# Patient Record
Sex: Male | Born: 1945 | Race: White | Hispanic: No | Marital: Single
Health system: Southern US, Community
[De-identification: ages and names within clinical notes are randomized; demographics above are authoritative.]

## PROBLEM LIST (undated history)

## (undated) DIAGNOSIS — I10 Essential (primary) hypertension: Secondary | ICD-10-CM

## (undated) DIAGNOSIS — E119 Type 2 diabetes mellitus without complications: Secondary | ICD-10-CM

## (undated) DIAGNOSIS — F0390 Unspecified dementia without behavioral disturbance: Secondary | ICD-10-CM

## (undated) DIAGNOSIS — I509 Heart failure, unspecified: Secondary | ICD-10-CM

## (undated) DIAGNOSIS — E7800 Pure hypercholesterolemia, unspecified: Secondary | ICD-10-CM

## (undated) DIAGNOSIS — L409 Psoriasis, unspecified: Secondary | ICD-10-CM

## (undated) HISTORY — PX: CORONARY ARTERY BYPASS GRAFT: SHX141

---

## 2020-07-02 ENCOUNTER — Emergency Department (HOSPITAL_COMMUNITY): Payer: Medicare HMO

## 2020-07-02 ENCOUNTER — Encounter (HOSPITAL_COMMUNITY): Payer: Self-pay | Admitting: Emergency Medicine

## 2020-07-02 ENCOUNTER — Other Ambulatory Visit: Payer: Self-pay

## 2020-07-02 ENCOUNTER — Emergency Department (HOSPITAL_COMMUNITY)
Admission: EM | Admit: 2020-07-02 | Discharge: 2020-07-02 | Disposition: A | Discharge status: Home / Self Care | Payer: Medicare HMO | Attending: Emergency Medicine | Admitting: Emergency Medicine

## 2020-07-02 DIAGNOSIS — F0390 Unspecified dementia without behavioral disturbance: Secondary | ICD-10-CM | POA: Insufficient documentation

## 2020-07-02 DIAGNOSIS — I509 Heart failure, unspecified: Secondary | ICD-10-CM | POA: Diagnosis not present

## 2020-07-02 DIAGNOSIS — Z951 Presence of aortocoronary bypass graft: Secondary | ICD-10-CM | POA: Insufficient documentation

## 2020-07-02 DIAGNOSIS — I110 Hypertensive heart disease with heart failure: Secondary | ICD-10-CM | POA: Diagnosis not present

## 2020-07-02 DIAGNOSIS — R55 Syncope and collapse: Secondary | ICD-10-CM | POA: Diagnosis not present

## 2020-07-02 DIAGNOSIS — L22 Diaper dermatitis: Secondary | ICD-10-CM | POA: Diagnosis not present

## 2020-07-02 DIAGNOSIS — B372 Candidiasis of skin and nail: Secondary | ICD-10-CM

## 2020-07-02 DIAGNOSIS — R112 Nausea with vomiting, unspecified: Secondary | ICD-10-CM | POA: Insufficient documentation

## 2020-07-02 DIAGNOSIS — E119 Type 2 diabetes mellitus without complications: Secondary | ICD-10-CM | POA: Diagnosis not present

## 2020-07-02 HISTORY — DX: Heart failure, unspecified: I50.9

## 2020-07-02 HISTORY — DX: Unspecified dementia, unspecified severity, without behavioral disturbance, psychotic disturbance, mood disturbance, and anxiety: F03.90

## 2020-07-02 HISTORY — DX: Psoriasis, unspecified: L40.9

## 2020-07-02 HISTORY — DX: Pure hypercholesterolemia, unspecified: E78.00

## 2020-07-02 HISTORY — DX: Essential (primary) hypertension: I10

## 2020-07-02 HISTORY — DX: Type 2 diabetes mellitus without complications: E11.9

## 2020-07-02 LAB — COMPREHENSIVE METABOLIC PANEL
ALT: 36 U/L (ref 0–44)
AST: 22 U/L (ref 15–41)
Albumin: 3 g/dL — ABNORMAL LOW (ref 3.5–5.0)
Alkaline Phosphatase: 80 U/L (ref 38–126)
Anion gap: 9 (ref 5–15)
BUN: 22 mg/dL (ref 8–23)
CO2: 22 mmol/L (ref 22–32)
Calcium: 8.6 mg/dL — ABNORMAL LOW (ref 8.9–10.3)
Chloride: 101 mmol/L (ref 98–111)
Creatinine, Ser: 1.36 mg/dL — ABNORMAL HIGH (ref 0.61–1.24)
GFR, Estimated: 55 mL/min — ABNORMAL LOW (ref >60)
Glucose, Bld: 404 mg/dL — ABNORMAL HIGH (ref 70–99)
Potassium: 4.5 mmol/L (ref 3.5–5.1)
Sodium: 132 mmol/L — ABNORMAL LOW (ref 135–145)
Total Bilirubin: 1 mg/dL (ref 0.3–1.2)
Total Protein: 5.5 g/dL — ABNORMAL LOW (ref 6.5–8.1)

## 2020-07-02 LAB — CBC WITH DIFFERENTIAL/PLATELET
Abs Immature Granulocytes: 0.22 10*3/uL — ABNORMAL HIGH (ref 0.00–0.07)
Basophils Absolute: 0.1 10*3/uL (ref 0.0–0.1)
Basophils Relative: 1 %
Eosinophils Absolute: 0.2 10*3/uL (ref 0.0–0.5)
Eosinophils Relative: 2 %
HCT: 41.2 % (ref 39.0–52.0)
Hemoglobin: 13.9 g/dL (ref 13.0–17.0)
Immature Granulocytes: 2 %
Lymphocytes Relative: 9 %
Lymphs Abs: 1.2 10*3/uL (ref 0.7–4.0)
MCH: 29.6 pg (ref 26.0–34.0)
MCHC: 33.7 g/dL (ref 30.0–36.0)
MCV: 87.7 fL (ref 80.0–100.0)
Monocytes Absolute: 0.7 10*3/uL (ref 0.1–1.0)
Monocytes Relative: 5 %
Neutro Abs: 11.4 10*3/uL — ABNORMAL HIGH (ref 1.7–7.7)
Neutrophils Relative %: 81 %
Platelets: 168 10*3/uL (ref 150–400)
RBC: 4.7 MIL/uL (ref 4.22–5.81)
RDW: 13.8 % (ref 11.5–15.5)
WBC: 13.8 10*3/uL — ABNORMAL HIGH (ref 4.0–10.5)
nRBC: 0 % (ref 0.0–0.2)

## 2020-07-02 LAB — CBG MONITORING, ED
Glucose-Capillary: 340 mg/dL — ABNORMAL HIGH (ref 70–99)
Glucose-Capillary: 411 mg/dL — ABNORMAL HIGH (ref 70–99)

## 2020-07-02 MED ORDER — ONDANSETRON 4 MG PO TBDP: 4.0000 mg | ORAL_TABLET | Freq: Three times a day (TID) | ORAL | 0 refills | Status: AC | PRN

## 2020-07-02 MED ORDER — NYSTATIN 100000 UNIT/GM EX CREA: TOPICAL_CREAM | CUTANEOUS | 0 refills | Status: AC

## 2020-07-02 MED ORDER — SODIUM CHLORIDE 0.9 % IV BOLUS
500.0000 mL | Freq: Once | INTRAVENOUS | Status: AC
  Administered 2020-07-02: 500 mL via INTRAVENOUS

## 2020-07-02 NOTE — ED Provider Notes (Signed)
[facility] EMERGENCY DEPARTMENT Provider Note   CSN: [id number] Arrival date & time: 07/02/20  1643     History Chief Complaint  Patient presents with  . Loss of Consciousness    Ross Ferguson is a 75 y.o. male.  75 year old male with prior medical history as detailed below presents for evaluation.  Patient is accompanied by his daughter who is at bedside.  He and his daughter are here from [facility] for a graduation celebration of the family member.  Patient stayed in a hotel last night.  He had less breakfast than normal.  Patient went to lunch at a restaurant.  During his lunch he had a near syncopal event.  Patient apparently had nausea.  He looks like he is about to pass out.  He did not have full LOC.  He did not throw up.  Patient reports that he feels fine now.  He denies associated chest pain or shortness of breath.  He denies recent illness or fever.  The patient's daughter who witnessed the event seconds all reported details.  The history is provided by the patient, medical records and a relative.  Near Syncope This is a new problem. The current episode started 3 to 5 hours ago. The problem occurs rarely. The problem has been resolved. Pertinent negatives include no chest pain, no abdominal pain, no headaches and no shortness of breath. Nothing aggravates the symptoms. Nothing relieves the symptoms.       Past Medical History:  Diagnosis Date  . CHF (congestive heart failure) (HCC)   . Dementia (HCC)   . Diabetes mellitus without complication (HCC)   . High cholesterol   . Hypertension   . Psoriasis     There are no problems to display for this patient.   Past Surgical History:  Procedure Laterality Date  . CORONARY ARTERY BYPASS GRAFT         No family history on file.  Social History   Tobacco Use  . Smoking status: Unknown If Ever Smoked  . Smokeless tobacco: Never Used  Substance Use Topics  . Alcohol use: Yes  .  Drug use: Not Currently    Home Medications Prior to Admission medications   Not on File    Allergies    Patient has no allergy information on record.  Review of Systems   Review of Systems  Respiratory: Negative for shortness of breath.   Cardiovascular: Positive for near-syncope. Negative for chest pain.  Gastrointestinal: Negative for abdominal pain.  Neurological: Negative for headaches.  All other systems reviewed and are negative.   Physical Exam Updated Vital Signs BP 128/70   Pulse 67   Temp 98 F (36.7 C) (Oral)   Resp 16   SpO2 100%   Physical Exam Vitals and nursing note reviewed.  Constitutional:      General: He is not in acute distress.    Appearance: He is well-developed.  HENT:     Head: Normocephalic and atraumatic.  Eyes:     Conjunctiva/sclera: Conjunctivae normal.     Pupils: Pupils are equal, round, and reactive to light.  Cardiovascular:     Rate and Rhythm: Normal rate and regular rhythm.     Heart sounds: Normal heart sounds.  Pulmonary:     Effort: Pulmonary effort is normal. No respiratory distress.     Breath sounds: Normal breath sounds.  Abdominal:     General: There is no distension.     Palpations: Abdomen is  soft.     Tenderness: There is no abdominal tenderness.  Genitourinary:    Comments: Mildly erythematous rash to the groin. Musculoskeletal:        General: No deformity. Normal range of motion.     Cervical back: Normal range of motion and neck supple.  Skin:    General: Skin is warm and dry.  Neurological:     Mental Status: He is alert and oriented to person, place, and time.     ED Results / Procedures / Treatments   Labs (all labs ordered are listed, but only abnormal results are displayed) Labs Reviewed  CBC WITH DIFFERENTIAL/PLATELET - Abnormal; Notable for the following components:      Result Value   WBC 13.8 (*)    Neutro Abs 11.4 (*)    Abs Immature Granulocytes 0.22 (*)    All other components within  normal limits  COMPREHENSIVE METABOLIC PANEL - Abnormal; Notable for the following components:   Sodium 132 (*)    Glucose, Bld 404 (*)    Creatinine, Ser 1.36 (*)    Calcium 8.6 (*)    Total Protein 5.5 (*)    Albumin 3.0 (*)    GFR, Estimated 55 (*)    All other components within normal limits  CBG MONITORING, ED - Abnormal; Notable for the following components:   Glucose-Capillary 340 (*)    All other components within normal limits  CBG MONITORING, ED - Abnormal; Notable for the following components:   Glucose-Capillary 411 (*)    All other components within normal limits    EKG EKG Interpretation  Date/Time:  Saturday July 02 2020 16:46:44 EDT Ventricular Rate:  69 PR Interval:  150 QRS Duration: 90 QT Interval:  340 QTC Calculation: 364 R Axis:   -64 Text Interpretation: Normal sinus rhythm Left anterior fascicular block Nonspecific T wave abnormality Abnormal ECG Confirmed by Kristine Royal [phone]) on 07/02/2020 5:08:32 PM   Radiology No results found.  Procedures Procedures   Medications Ordered in ED Medications  sodium chloride 0.9 % bolus 500 mL (has no administration in time range)    ED Course  I have reviewed the triage vital signs and the nursing notes.  Pertinent labs & imaging results that were available during my care of the patient were reviewed by me and considered in my medical decision making (see chart for details).    MDM Rules/Calculators/A&P                          MDM  MSE complete  Zayquan Bogard was evaluated in Emergency Department on 07/02/2020 for the symptoms described in the history of present illness. He was evaluated in the context of the global COVID-19 pandemic, which necessitated consideration that the patient might be at risk for infection with the SARS-CoV-2 virus that causes COVID-19. Institutional protocols and algorithms that pertain to the evaluation of patients at risk for COVID-19 are in a state of rapid change based on  information released by regulatory bodies including the CDC and federal and state organizations. These policies and algorithms were followed during the patient's care in the ED.  Patient presented for evaluation after near syncope.  Patient is near syncope was associated with nausea and vomiting.  The patient has not had any significant nausea since the incident.  He has not vomited.  Described symptoms are most likely vasovagal.  Screening labs are without significant abnormality.  Patient desires discharge after his ED  evaluation.  Importance of close follow-up is stressed.  Strict return precautions given and understood.   Final Clinical Impression(s) / ED Diagnoses Final diagnoses:  Near syncope  Candidal diaper rash    Rx / DC Orders ED Discharge Orders         Ordered    ondansetron (ZOFRAN ODT) 4 MG disintegrating tablet  Every 8 hours PRN        07/02/20 2051    nystatin cream (MYCOSTATIN)        07/02/20 2051           Wynetta Fines, MD 07/02/20 2056

## 2020-07-02 NOTE — ED Notes (Signed)
Patient transported to X-ray 

## 2020-07-02 NOTE — ED Provider Notes (Signed)
Emergency Medicine Provider Triage Evaluation Note  Ross Ferguson , a 75 y.o. male  was evaluated in triage.  Pt complains of syncope. EMS reports at lunch he went pale, became diaphoretic, had tremors without seizure activity. EMS arrived 10 minutes later and patient was lethargic. Had an episode of emesis prior to arrival, had only a bite of his lunch. No recent illness. Hypotensive for ems 80s systolic, hyperglycemia 350s, orthostatic +, decreased PO intake today. Patient gave 500 L NS. Negative stroke screen for EMS. Has history of dementia. Daughter is on the way here  Review of Systems  Positive: Syncope, emesis Negative: CP, headache, abdominal pain, fever,  Physical Exam  BP 137/73 (BP Location: Right Arm)   Pulse 67   Temp 98 F (36.7 C) (Oral)   Resp 16   SpO2 100%  Gen:   Awake, no distress  Resp:  Normal effort  MSK:   Moves extremities without difficulty  Other:  Alert and oriented to self, baseline per EMS report.  Medical Decision Making  Medically screening exam initiated at 4:50 PM.  Appropriate orders placed.  Havoc Sanluis was informed that the remainder of the evaluation will be completed by another provider, this initial triage assessment does not replace that evaluation, and the importance of remaining in the ED until their evaluation is complete.  Labs, chest xray, EKG ordered. Patient normotensive in triage.   Portions of this note were generated with Scientist, clinical (histocompatibility and immunogenetics). Dictation errors may occur despite best attempts at proofreading.    Shanon Ace, PA-C 07/02/20 1651    Wynetta Fines, MD 07/03/20 [phone]

## 2020-07-02 NOTE — ED Notes (Signed)
The pt was  Sitting  At a restaurant and became dizzy and almost passed out.  He only had watermellon this am for breakfast.  He is visiting in town for a graduation of a granddaughter.  The daughter at bedside reports that ems checked the pts cbg and found it to be in the 300s  At present the pt is alert and oriented x 4

## 2020-07-02 NOTE — ED Notes (Signed)
Labs already drawn and resulted not marked off

## 2020-07-02 NOTE — ED Triage Notes (Signed)
Pt to triage via [facility] from a restaurant.  Pt had a syncopal episode witnessed by family.  No fall.  Family reported tremors.  Pt pale on EMS arrival BP 80/40.  CBG 355.  91% on RA.  18g LAC.  Pt reports dizziness prior to syncopal event.  Denies dizziness at present.  Denies any symptoms at this time.

## 2020-07-02 NOTE — Discharge Instructions (Addendum)
Return for any problem.  Apply nystatin cream to the affected area or groin.  Use Zofran if necessary for treatment of nausea.

## 2022-10-17 IMAGING — CR DG CHEST 2V
2 series · 2 of 2 positions shown · non-contrast
Comparison: None.

CLINICAL DATA: Syncope

EXAM:
CHEST - 2 VIEW

[chest lat]
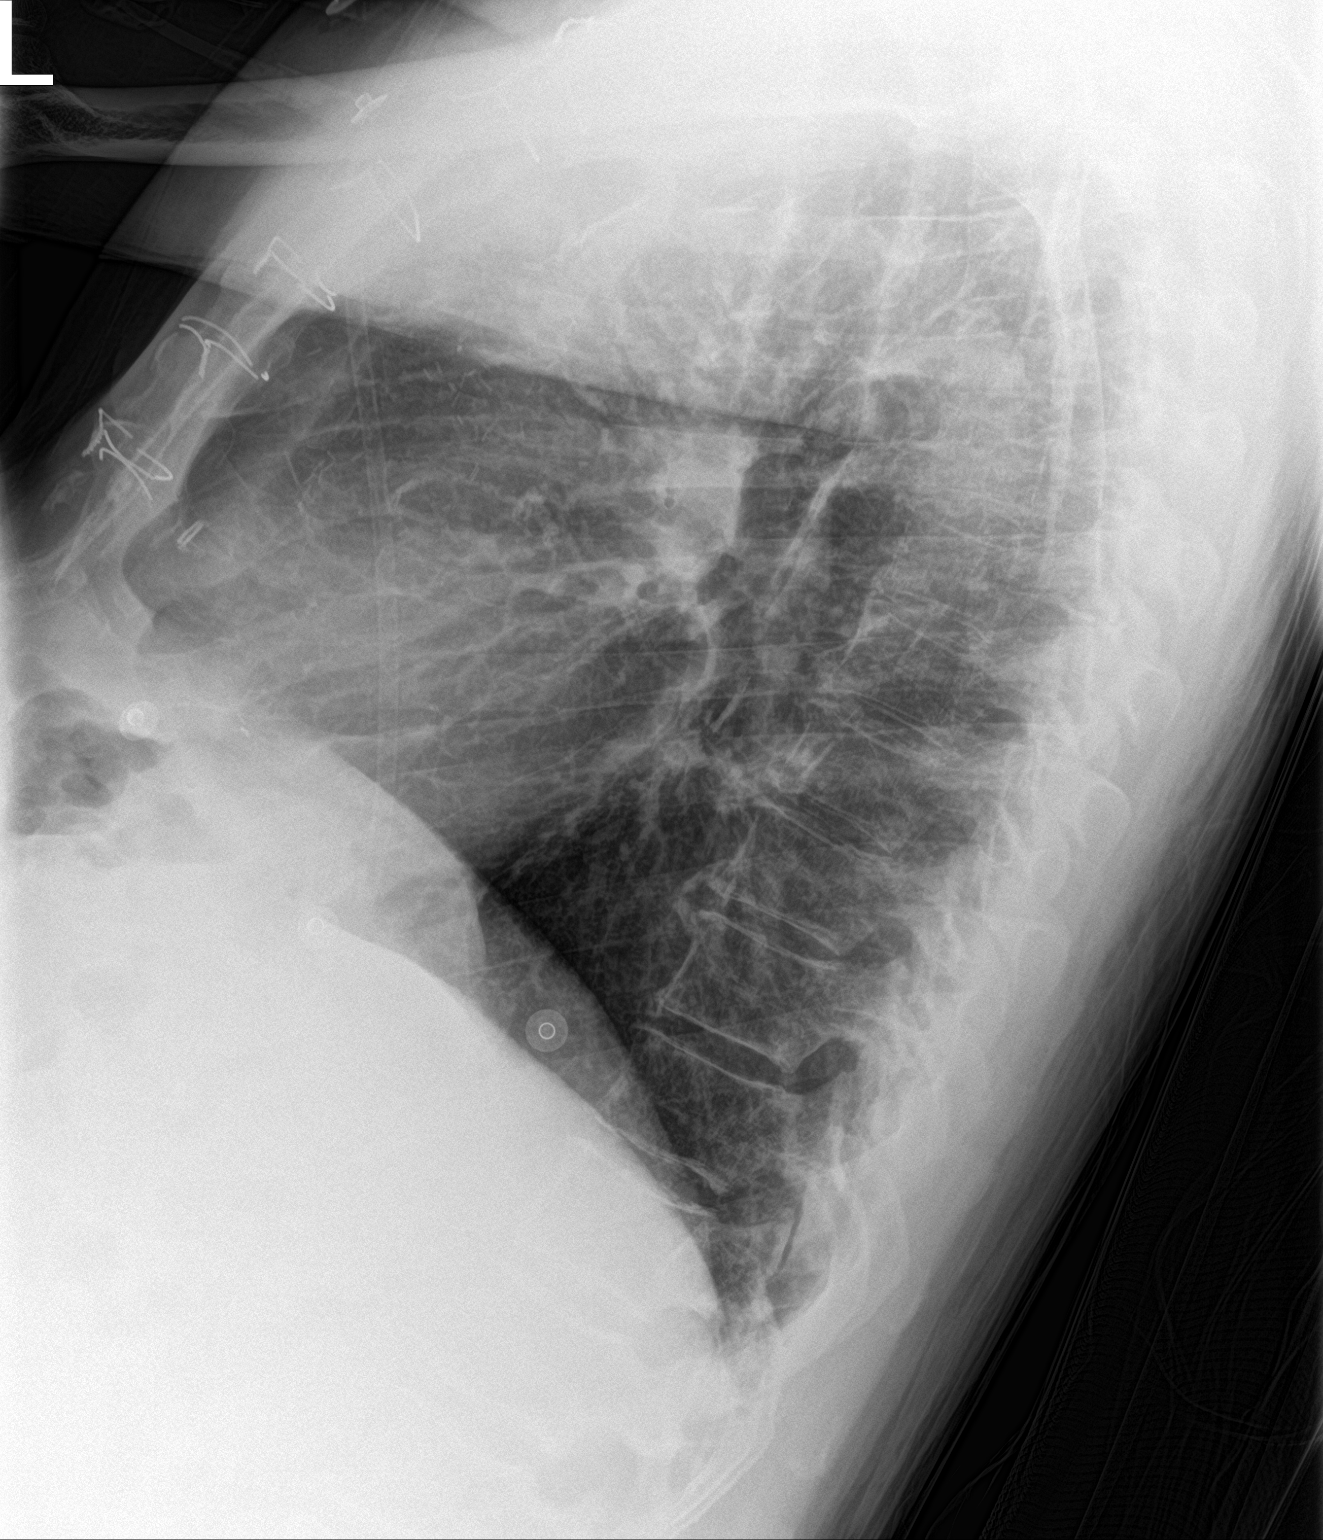

[chest ap]
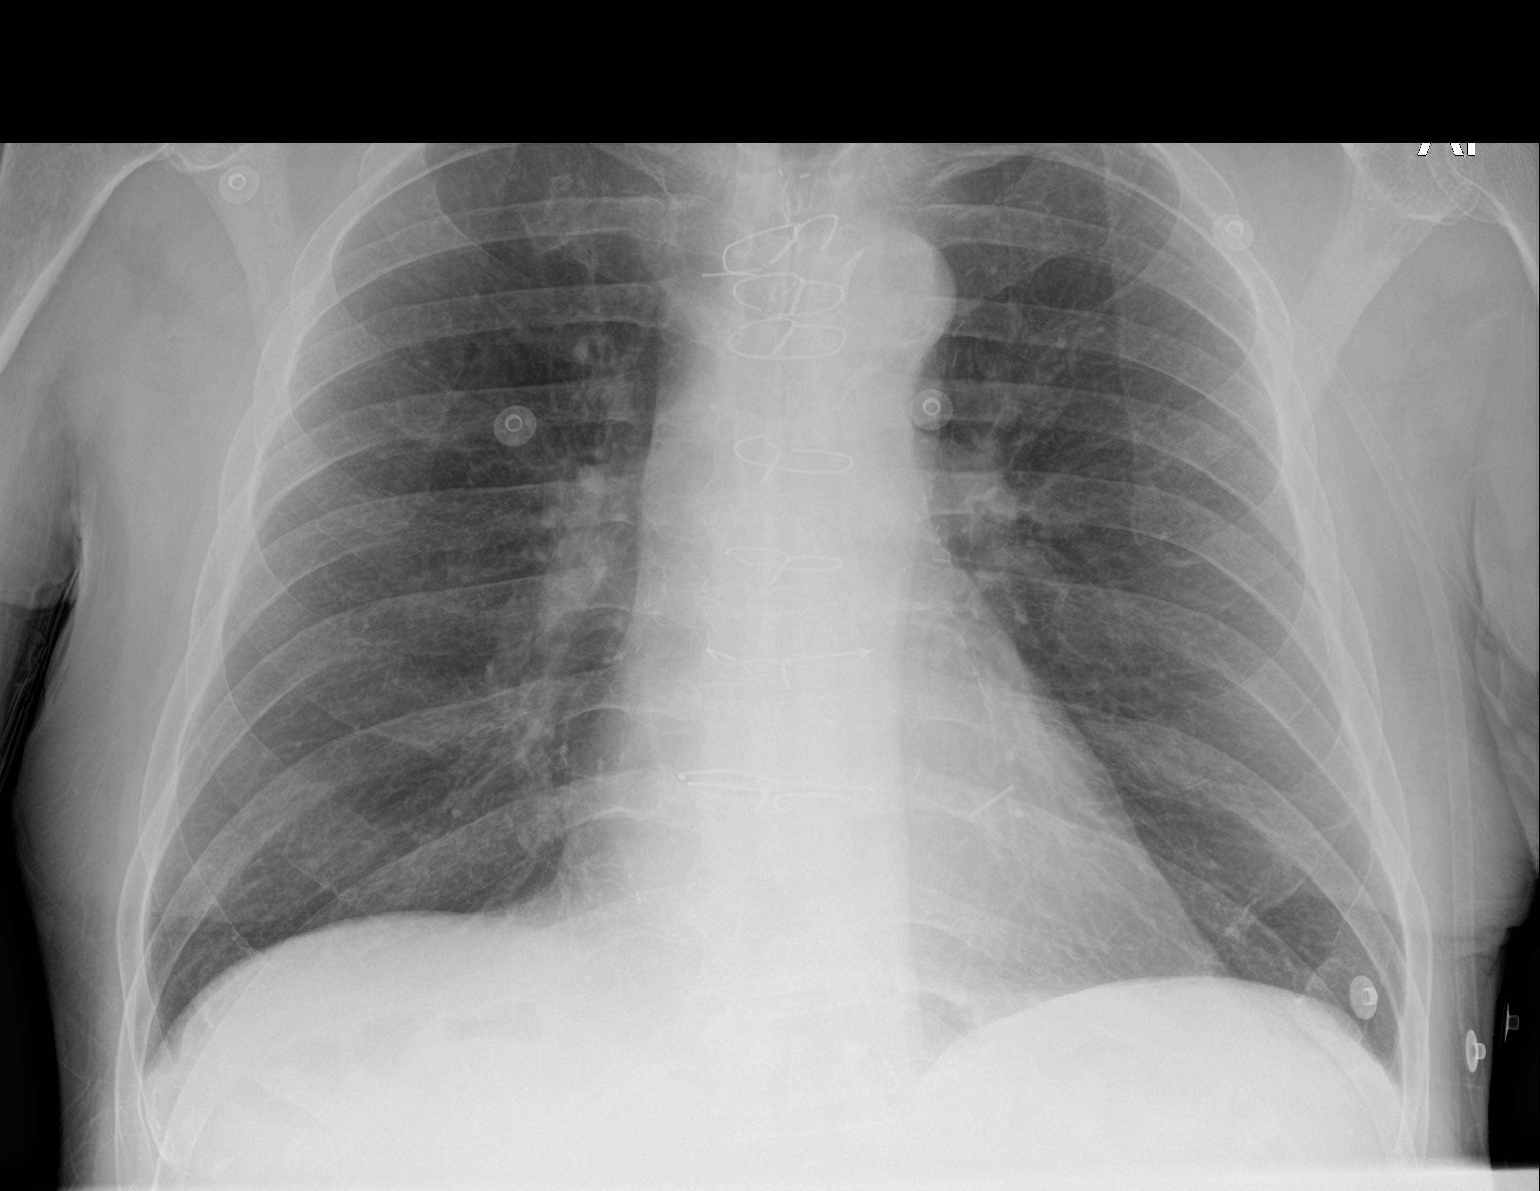

[2 of 2 positions shown; findings below may reference images not displayed]

FINDINGS: The heart size is normal. Vascular calcifications are seen in the
aortic arch. Both lungs are clear. Multiple median sternotomy wires
are fractured. Degenerative changes are seen in the spine.
IMPRESSION: No active cardiopulmonary disease.

Aortic Atherosclerosis ([id number].C).
